# Patient Record
Sex: Female | Born: 1960 | Race: White | Hispanic: No | Marital: Married | Smoking: Never smoker
Health system: Southern US, Community
[De-identification: ages and names within clinical notes are randomized; demographics above are authoritative.]

## PROBLEM LIST (undated history)

## (undated) DIAGNOSIS — E119 Type 2 diabetes mellitus without complications: Secondary | ICD-10-CM

## (undated) DIAGNOSIS — I10 Essential (primary) hypertension: Secondary | ICD-10-CM

## (undated) DIAGNOSIS — K5792 Diverticulitis of intestine, part unspecified, without perforation or abscess without bleeding: Secondary | ICD-10-CM

## (undated) HISTORY — PX: KIDNEY STONE SURGERY: SHX686

## (undated) HISTORY — PX: TONSILLECTOMY: SUR1361

## (undated) HISTORY — PX: CHOLECYSTECTOMY: SHX55

---

## 2005-12-29 ENCOUNTER — Emergency Department (HOSPITAL_COMMUNITY): Admission: EM | Admit: 2005-12-29 | Discharge: 2005-12-29 | Payer: Self-pay | Admitting: Emergency Medicine

## 2006-04-05 ENCOUNTER — Emergency Department (HOSPITAL_COMMUNITY): Admission: EM | Admit: 2006-04-05 | Discharge: 2006-04-06 | Payer: Self-pay | Admitting: Emergency Medicine

## 2009-07-15 ENCOUNTER — Emergency Department (HOSPITAL_BASED_OUTPATIENT_CLINIC_OR_DEPARTMENT_OTHER): Admission: EM | Admit: 2009-07-15 | Discharge: 2009-07-16 | Payer: Self-pay | Admitting: Emergency Medicine

## 2009-07-16 ENCOUNTER — Ambulatory Visit: Payer: Self-pay | Admitting: Diagnostic Radiology

## 2009-07-20 ENCOUNTER — Ambulatory Visit (HOSPITAL_COMMUNITY): Admission: AD | Admit: 2009-07-20 | Discharge: 2009-07-20 | Payer: Self-pay | Admitting: Urology

## 2011-03-03 LAB — URINALYSIS, ROUTINE W REFLEX MICROSCOPIC
Glucose, UA: NEGATIVE mg/dL
Ketones, ur: NEGATIVE mg/dL
Leukocytes, UA: NEGATIVE
Nitrite: NEGATIVE
Protein, ur: NEGATIVE mg/dL
pH: 7 (ref 5.0–8.0)

## 2011-03-03 LAB — BASIC METABOLIC PANEL
CO2: 30 mEq/L (ref 19–32)
Calcium: 9.7 mg/dL (ref 8.4–10.5)
Chloride: 104 mEq/L (ref 96–112)
GFR calc Af Amer: >60 mL/min (ref >60)
GFR calc non Af Amer: >60 mL/min (ref >60)
Glucose, Bld: 113 mg/dL — ABNORMAL HIGH (ref 70–99)

## 2011-03-03 LAB — CBC
HCT: 41.2 % (ref 36.0–46.0)
MCHC: 34.1 g/dL (ref 30.0–36.0)
MCV: 91 fL (ref 78.0–100.0)
RDW: 12.5 % (ref 11.5–15.5)
WBC: 11.4 10*3/uL — ABNORMAL HIGH (ref 4.0–10.5)

## 2011-03-03 LAB — DIFFERENTIAL
Basophils Absolute: 0 10*3/uL (ref 0.0–0.1)
Eosinophils Absolute: 0.1 10*3/uL (ref 0.0–0.7)
Eosinophils Relative: 1 % (ref 0–5)
Lymphocytes Relative: 20 % (ref 12–46)
Monocytes Absolute: 0.8 10*3/uL (ref 0.1–1.0)
Neutro Abs: 8.3 10*3/uL — ABNORMAL HIGH (ref 1.7–7.7)

## 2011-03-03 LAB — URINE MICROSCOPIC-ADD ON

## 2011-03-03 LAB — PREGNANCY, URINE: Preg Test, Ur: NEGATIVE

## 2011-04-10 NOTE — Op Note (Signed)
Barbara Ford, Barbara Ford                ACCOUNT NO.:  [id number]   MEDICAL RECORD NO.:  [id number]          PATIENT TYPE:  AMB   LOCATION:  DAY                          FACILITY:  [facility]   PHYSICIAN:  Excell Seltzer. Annabell Howells, M.D.    DATE OF BIRTH:  Sep 05, 1961   DATE OF PROCEDURE:  07/20/2009  DATE OF DISCHARGE:                               OPERATIVE REPORT   PROCEDURE:  Cystoscopy, left retrograde pyelogram, left ureteroscopic  stone extraction with lasertripsy, insertion of left double-J stent.   PREOPERATIVE DIAGNOSIS:  Left distal ureteral stone.   POSTOPERATIVE DIAGNOSIS:  Left distal ureteral stone.   SURGEON:  Excell Seltzer. Annabell Howells, MD.   ANESTHESIA:  General.   SPECIMENS:  Stone fragments.   DRAINS:  6-French x 24 cm double-J stent.   COMPLICATIONS:  None.   INDICATIONS:  Barbara Ford is a 50 year old white female with a history of  stones, who presented with left flank pain and was found to have an 8 x  6 mm left distal ureteral stone on CT earlier this week.  After  discussing the options, she has elected ureteroscopy.   FINDINGS AND PROCEDURE:  The patient was taken to the operating room  where a general anesthetic was induced.  She was given Cipro.  She was  placed in the lithotomy position.  Her perineum and genitalia were  prepped with Betadine solution.  She was draped in the usual sterile  fashion.  She had asked that I access repair of vault for prolapse.  She  does have a grade 3 broad-based cystocele and a grade 2 distal  rectocele.   Cystoscopy was performed with the 22-French scope and 12 degrees lens.  Examination revealed a normal urethra.  The bladder wall had mild  trabeculation.  No tumors or stones were noted.  The ureteral orifices  were unremarkable.   The left ureteral orifice was cannulated with a 5-French open-end  catheter and contrast was instilled.  This revealed some hydronephrosis  above the stone in the distal ureter.   A guidewire was then passed through  the open-end catheter to the kidney  and the cystoscope was removed.  A  12-French dilator was passed over  the wire to the stone.  It would not advance by the stone.   The 6-French short ureteroscope was then passed alongside the wire to  the stone.  The stone was engaged with a 364 micron laser fiber  initially at 0.5 watts and 5 Hz, but later a 0.8 watts and 10 Hz.  The  stone was fragmented into manageable fragments, which were then grasped  with a nitinol basket and removed.  After removal of all the large  fragments, only a small amount of dust was noted.   I had initially attended to do this stentless, but because of the  impacted nature of the stone and the difficulty getting by the area  impaction once the stone had been broken, I felt that a short-term stent  was indicated.   A 6-French 24 cm double-J stent with string was passed through the  cystoscope  over the wire to the kidney under fluoroscopic guidance.  The  wire was removed, leaving a good coil in the kidney and a good coil in  the bladder.  The stone fragments were collected and will be given to  the family.  Her bladder was drained and the stent string was left  exiting the urethra.  It was tied close to the urethra and trimmed. She  was taken down from the lithotomy position after removal of the drapes,  her anesthetic was reversed, she was taken to the recovery room in  stable condition.  There no complications.  She will go home with a  prescription for Dilaudid and also instructions to get Azo a standard  over-the-counter for bladder discomfort.  She will call to follow up  with me in 2 to 3 weeks and can remove the stent at home in 2 to 3 days.      Excell Seltzer. Annabell Howells, M.D.  Electronically Signed     JJW/MEDQ  D:  07/20/2009  T:  07/20/2009  Job:  [id number]

## 2011-11-18 ENCOUNTER — Emergency Department (HOSPITAL_COMMUNITY): Payer: BC Managed Care – PPO

## 2011-11-18 ENCOUNTER — Emergency Department (HOSPITAL_COMMUNITY)
Admission: EM | Admit: 2011-11-18 | Discharge: 2011-11-19 | Disposition: A | Discharge status: Home / Self Care | Payer: BC Managed Care – PPO | Attending: Emergency Medicine | Admitting: Emergency Medicine

## 2011-11-18 ENCOUNTER — Other Ambulatory Visit: Payer: Self-pay

## 2011-11-18 DIAGNOSIS — R079 Chest pain, unspecified: Secondary | ICD-10-CM | POA: Insufficient documentation

## 2011-11-18 DIAGNOSIS — R10812 Left upper quadrant abdominal tenderness: Secondary | ICD-10-CM | POA: Insufficient documentation

## 2011-11-18 DIAGNOSIS — R112 Nausea with vomiting, unspecified: Secondary | ICD-10-CM

## 2011-11-18 DIAGNOSIS — R42 Dizziness and giddiness: Secondary | ICD-10-CM | POA: Insufficient documentation

## 2011-11-18 DIAGNOSIS — R55 Syncope and collapse: Secondary | ICD-10-CM | POA: Insufficient documentation

## 2011-11-18 HISTORY — DX: Diverticulitis of intestine, part unspecified, without perforation or abscess without bleeding: K57.92

## 2011-11-18 LAB — CARDIAC PANEL(CRET KIN+CKTOT+MB+TROPI)
CK, MB: 1.7 ng/mL (ref 0.3–4.0)
CK, MB: 1.6 ng/mL (ref 0.3–4.0)
Total CK: 98 U/L (ref 7–177)
Total CK: 93 U/L (ref 7–177)
Total CK: 77 U/L (ref 7–177)
Troponin I: <0.3 ng/mL (ref <0.30)

## 2011-11-18 LAB — COMPREHENSIVE METABOLIC PANEL
ALT: 37 U/L — ABNORMAL HIGH (ref 0–35)
Alkaline Phosphatase: 131 U/L — ABNORMAL HIGH (ref 39–117)
BUN: 13 mg/dL (ref 6–23)
GFR calc Af Amer: >90 mL/min (ref >90)
GFR calc non Af Amer: >90 mL/min (ref >90)
Potassium: 3.7 mEq/L (ref 3.5–5.1)
Sodium: 137 mEq/L (ref 135–145)
Total Protein: 7.3 g/dL (ref 6.0–8.3)

## 2011-11-18 LAB — CBC
MCH: 30.9 pg (ref 26.0–34.0)
MCHC: 33.8 g/dL (ref 30.0–36.0)
RBC: 4.37 MIL/uL (ref 3.87–5.11)
WBC: 15.2 10*3/uL — ABNORMAL HIGH (ref 4.0–10.5)

## 2011-11-18 LAB — URINALYSIS, ROUTINE W REFLEX MICROSCOPIC
Glucose, UA: NEGATIVE mg/dL
Protein, ur: NEGATIVE mg/dL
Specific Gravity, Urine: 1.022 (ref 1.005–1.030)
pH: 7 (ref 5.0–8.0)

## 2011-11-18 LAB — MAGNESIUM: Magnesium: 2.1 mg/dL (ref 1.5–2.5)

## 2011-11-18 MED ORDER — ONDANSETRON HCL 4 MG PO TABS: 4.0000 mg | ORAL_TABLET | Freq: Four times a day (QID) | ORAL | Status: AC

## 2011-11-18 MED ORDER — ONDANSETRON HCL 4 MG/2ML IJ SOLN
4.0000 mg | Freq: Once | INTRAMUSCULAR | Status: AC
  Administered 2011-11-18: 4 mg via INTRAMUSCULAR
  Filled 2011-11-18: qty 2

## 2011-11-18 MED ORDER — ASPIRIN 81 MG PO CHEW: 324.0000 mg | CHEWABLE_TABLET | Freq: Once | ORAL | Status: DC

## 2011-11-18 MED ORDER — ONDANSETRON HCL 4 MG/2ML IJ SOLN: 4.0000 mg | Freq: Once | INTRAMUSCULAR | Status: DC

## 2011-11-18 MED ORDER — ONDANSETRON HCL 4 MG/2ML IJ SOLN
INTRAMUSCULAR | Status: AC
  Administered 2011-11-18: 4 mg via INTRAVENOUS
  Filled 2011-11-18: qty 2

## 2011-11-18 MED ORDER — SODIUM CHLORIDE 0.9 % IV SOLN
999.0000 mL | Freq: Once | INTRAVENOUS | Status: AC
  Administered 2011-11-18: 999 mL via INTRAVENOUS

## 2011-11-18 MED ORDER — HYDROMORPHONE HCL PF 1 MG/ML IJ SOLN
INTRAMUSCULAR | Status: AC
  Filled 2011-11-18: qty 1

## 2011-11-18 MED ORDER — HYDROMORPHONE HCL PF 1 MG/ML IJ SOLN
1.0000 mg | Freq: Once | INTRAMUSCULAR | Status: AC
  Administered 2011-11-18: 1 mg via INTRAVENOUS
  Filled 2011-11-18: qty 1

## 2011-11-18 MED ORDER — ONDANSETRON HCL 4 MG/2ML IJ SOLN
4.0000 mg | Freq: Once | INTRAMUSCULAR | Status: AC
  Administered 2011-11-18: 4 mg via INTRAVENOUS
  Filled 2011-11-18: qty 2

## 2011-11-18 MED ORDER — ONDANSETRON HCL 4 MG/2ML IJ SOLN
4.0000 mg | Freq: Once | INTRAMUSCULAR | Status: AC
  Administered 2011-11-18: 4 mg via INTRAVENOUS

## 2011-11-18 MED ORDER — HYDROMORPHONE HCL PF 1 MG/ML IJ SOLN
1.0000 mg | Freq: Once | INTRAMUSCULAR | Status: AC
  Administered 2011-11-18: 1 mg via INTRAVENOUS

## 2011-11-18 MED ORDER — ONDANSETRON HCL 4 MG/2ML IJ SOLN
INTRAMUSCULAR | Status: AC
  Filled 2011-11-18: qty 2

## 2011-11-18 MED ORDER — CIPROFLOXACIN HCL 500 MG PO TABS: 500.0000 mg | ORAL_TABLET | Freq: Two times a day (BID) | ORAL | Status: AC

## 2011-11-18 MED ORDER — METRONIDAZOLE 500 MG PO TABS: 500.0000 mg | ORAL_TABLET | Freq: Two times a day (BID) | ORAL | Status: AC

## 2011-11-18 NOTE — ED Notes (Signed)
CDU full at this time, RN to call back later

## 2011-11-18 NOTE — ED Notes (Signed)
Patient presents via EMS with sudden onset of substernal chest pain, and dizziness at 1400 today with nausea and vomiting, pain was 6/10 with relief from 1 nitro SL.  324 ASA given as well.

## 2011-11-18 NOTE — ED Provider Notes (Signed)
Patient moved to CDU pending completion of cardiac marker testing.  Patient continues to have abdominal discomfort and nausea.  Patient relates recent evaluation for abdominal pain, diagnosed with diverticulitis and/or crohns disease.  Has been referred to a gastroenterologist in [facility] but has not yet been for follow-up.  Patient states her symptoms today are very similar to previous GI presentation.  First two sets of cardiac markers negative, third pending.  Jimmye Norman, NP 11/18/11 2150

## 2011-11-18 NOTE — ED Notes (Signed)
Patient transported to CT 

## 2011-11-18 NOTE — ED Notes (Signed)
Pt stated that she needed to go to the bathroom. Pt able to ambulate to bathroom. Pt states diarrhea and vomited. Pt states abdominal pain and still feels nauseated. MD aware and orders placed.

## 2011-11-18 NOTE — ED Provider Notes (Signed)
History     CSN: [id number]  Arrival date & time 11/18/11  1533   First MD Initiated Contact with Patient 11/18/11 1543      Chief Complaint  Patient presents with  . Chest Pain  . Nausea    (Consider location/radiation/quality/duration/timing/severity/associated sxs/prior treatment) HPI Patient presents immediately after an episode of acute onset n/v w cp and near-syncope.  She was ambulating when her Sx began, and she nearly fell to the ground due to the severity of her Sx.  Her condition eased somewhat (spontaneously), but on presentation she c/o mild LH and persistent nausea.  The CP was described as "pulsating" and "pressure."  The pain was sternal and non-radiating. Past Medical History  Diagnosis Date  . Diverticulitis   . Endometriosis     Past Surgical History  Procedure Date  . Cholecystectomy   . Kidney stone surgery     History reviewed. No pertinent family history.  History  Substance Use Topics  . Smoking status: Never Smoker   . Smokeless tobacco: Not on file  . Alcohol Use: No    OB History    Grav Para Term Preterm Abortions TAB SAB Ect Mult Living                  Review of Systems  Constitutional:       HPI  HENT:       HPI otherwise negative  Eyes: Negative.   Respiratory:       HPI, otherwise negative  Cardiovascular:       HPI, otherwise nmegative  Gastrointestinal: Positive for nausea and vomiting. Negative for abdominal pain.  Genitourinary:       HPI, otherwise negative  Musculoskeletal:       HPI, otherwise negative  Skin: Negative.   Neurological: Negative for syncope.    Allergies  Review of patient's allergies indicates no known allergies.  Home Medications  No current outpatient prescriptions on file.  BP 149/75  Pulse 83  Temp 98 F (36.7 C)  Resp 13  SpO2 97%  Physical Exam  Nursing note and vitals reviewed. Constitutional: She is oriented to person, place, and time. She appears well-developed and  well-nourished. No distress.  HENT:  Head: Normocephalic and atraumatic.  Eyes: Conjunctivae and EOM are normal.  Cardiovascular: Normal rate and regular rhythm.   Pulmonary/Chest: Effort normal and breath sounds normal. No stridor. No respiratory distress.  Abdominal: Normal appearance. She exhibits no distension. There is no hepatosplenomegaly. There is tenderness in the left upper quadrant. There is no rigidity, no rebound, no guarding, no CVA tenderness, no tenderness at McBurney's point and negative Murphy's sign.  Musculoskeletal: She exhibits no edema.  Neurological: She is alert and oriented to person, place, and time. No cranial nerve deficit.  Skin: Skin is warm and dry.  Psychiatric: She has a normal mood and affect.    ED Course  Procedures (including critical care time)   Labs Reviewed  CBC  CARDIAC PANEL(CRET KIN+CKTOT+MB+TROPI)  COMPREHENSIVE METABOLIC PANEL  MAGNESIUM  URINALYSIS, ROUTINE W REFLEX MICROSCOPIC   No results found.   No diagnosis found.  Cardiac Monitor: 82, SR, normal PulseOx 99% RA: Normal   Date: 11/18/2011  Rate:80  Rhythm: normal sinus rhythm  QRS Axis: normal  Intervals: normal  ST/T Wave abnormalities: nonspecific T wave changes  Conduction Disutrbances:none  Narrative Interpretation:   Old EKG Reviewed: changes noted  ABNORMAL ECG  CXR: no acute findings, reviewed by me.  MDM  This 50 year old  female presents following the sudden onset of nausea, vomiting, lightheadedness and chest discomfort. On exam the patient is in no distress, with vital signs that are unremarkable. The patient's presentation is concerning for acute ischemic event versus GI infection. The patient's labs are notable for mild leukocytosis. The patient's ECG is minimally abnormal, and her chest x-ray is further reassuring. Given the absence of ongoing discomfort, the patient's unremarkable evaluation thus far, she will be monitored for cardiac enzyme elevations,  and if these do not occur she'll be discharged to follow up with her primary care physician        Gerhard Munch, MD 11/18/11 [phone]

## 2011-11-18 NOTE — ED Notes (Signed)
Pt vomiting at the time. Medicated. Will continue to monitor. Physician notified.

## 2011-11-18 NOTE — ED Notes (Signed)
Called Barbara Ford (patient's mom per patient's request) just to let her know that patient is doing fine.  [phone]

## 2011-11-18 NOTE — ED Provider Notes (Signed)
Diagnostic results reviewed and discussed with Dr. Jeraldine Loots and with patient.  No clear indication of obstruction on plain films.  Suspect Crohn's flare.  Patient states she would like to attempt management at home.  Patient is requested to follow-up with her PCP and/or gastroenterologist.  Jimmye Norman, NP 11/18/11 [phone]

## 2011-11-19 MED ORDER — OXYCODONE-ACETAMINOPHEN 10-325 MG PO TABS: 1.0000 | ORAL_TABLET | Freq: Four times a day (QID) | ORAL | Status: AC | PRN

## 2011-11-19 NOTE — ED Provider Notes (Signed)
I have seen and evaluated the patient, please see my original note for my exam / MDM.  Gerhard Munch, MD 11/19/11 [phone]

## 2011-11-19 NOTE — ED Provider Notes (Signed)
I have seen and evaluated the patient, please see my original note for my exam / MDM.  Maitri Schnoebelen, MD 11/19/11 0011 

## 2011-11-19 NOTE — ED Notes (Signed)
Pt NAD, resp e/u, AOx4. Pt states understanding of discharge instructions and denies questions at time of dishcarge. Pt ambulatory with steady gait.

## 2017-03-10 ENCOUNTER — Emergency Department (HOSPITAL_COMMUNITY): Payer: No Typology Code available for payment source

## 2017-03-10 ENCOUNTER — Emergency Department (HOSPITAL_COMMUNITY)
Admission: EM | Admit: 2017-03-10 | Discharge: 2017-03-10 | Disposition: A | Discharge status: Home / Self Care | Payer: No Typology Code available for payment source | Attending: Emergency Medicine | Admitting: Emergency Medicine

## 2017-03-10 DIAGNOSIS — Y92410 Unspecified street and highway as the place of occurrence of the external cause: Secondary | ICD-10-CM | POA: Insufficient documentation

## 2017-03-10 DIAGNOSIS — Y9389 Activity, other specified: Secondary | ICD-10-CM | POA: Insufficient documentation

## 2017-03-10 DIAGNOSIS — M545 Low back pain, unspecified: Secondary | ICD-10-CM

## 2017-03-10 DIAGNOSIS — Y999 Unspecified external cause status: Secondary | ICD-10-CM | POA: Insufficient documentation

## 2017-03-10 DIAGNOSIS — S3992XA Unspecified injury of lower back, initial encounter: Secondary | ICD-10-CM | POA: Insufficient documentation

## 2017-03-10 DIAGNOSIS — S199XXA Unspecified injury of neck, initial encounter: Secondary | ICD-10-CM | POA: Insufficient documentation

## 2017-03-10 LAB — BASIC METABOLIC PANEL
Anion gap: 13 (ref 5–15)
BUN: 8 mg/dL (ref 6–20)
CHLORIDE: 101 mmol/L (ref 101–111)
CO2: 25 mmol/L (ref 22–32)
CREATININE: 0.69 mg/dL (ref 0.44–1.00)
Calcium: 9.9 mg/dL (ref 8.9–10.3)
GFR calc non Af Amer: >60 mL/min (ref >60)
Glucose, Bld: 122 mg/dL — ABNORMAL HIGH (ref 65–99)
POTASSIUM: 3.6 mmol/L (ref 3.5–5.1)
SODIUM: 139 mmol/L (ref 135–145)

## 2017-03-10 LAB — CBC WITH DIFFERENTIAL/PLATELET
BASOS PCT: 0 %
Basophils Absolute: 0 10*3/uL (ref 0.0–0.1)
EOS ABS: 0.2 10*3/uL (ref 0.0–0.7)
Eosinophils Relative: 1 %
HEMATOCRIT: 44.6 % (ref 36.0–46.0)
HEMOGLOBIN: 14.5 g/dL (ref 12.0–15.0)
Lymphocytes Relative: 26 %
Lymphs Abs: 3.4 10*3/uL (ref 0.7–4.0)
MCH: 29.2 pg (ref 26.0–34.0)
MCHC: 32.5 g/dL (ref 30.0–36.0)
MCV: 89.9 fL (ref 78.0–100.0)
MONOS PCT: 5 %
Monocytes Absolute: 0.6 10*3/uL (ref 0.1–1.0)
NEUTROS ABS: 8.7 10*3/uL — AB (ref 1.7–7.7)
NEUTROS PCT: 68 %
Platelets: 293 10*3/uL (ref 150–400)
RBC: 4.96 MIL/uL (ref 3.87–5.11)
RDW: 13.8 % (ref 11.5–15.5)
WBC: 12.9 10*3/uL — AB (ref 4.0–10.5)

## 2017-03-10 LAB — CBG MONITORING, ED: GLUCOSE-CAPILLARY: 114 mg/dL — AB (ref 65–99)

## 2017-03-10 LAB — I-STAT TROPONIN, ED: TROPONIN I, POC: 0.01 ng/mL (ref 0.00–0.08)

## 2017-03-10 MED ORDER — MORPHINE SULFATE (PF) 4 MG/ML IV SOLN
4.0000 mg | Freq: Once | INTRAVENOUS | Status: AC
  Administered 2017-03-10: 4 mg via INTRAVENOUS
  Filled 2017-03-10: qty 1

## 2017-03-10 MED ORDER — ONDANSETRON HCL 4 MG/2ML IJ SOLN
4.0000 mg | Freq: Once | INTRAMUSCULAR | Status: AC
  Administered 2017-03-10: 4 mg via INTRAVENOUS
  Filled 2017-03-10: qty 2

## 2017-03-10 MED ORDER — HYDROMORPHONE HCL 1 MG/ML IJ SOLN
1.0000 mg | Freq: Once | INTRAMUSCULAR | Status: AC
  Administered 2017-03-10: 1 mg via INTRAVENOUS
  Filled 2017-03-10: qty 1

## 2017-03-10 MED ORDER — HYDROCODONE-ACETAMINOPHEN 5-325 MG PO TABS: 1.0000 | ORAL_TABLET | ORAL | 0 refills | Status: AC | PRN
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
## 2017-03-10 NOTE — ED Notes (Signed)
Patient transported to CT 

## 2017-03-10 NOTE — Discharge Instructions (Signed)
The CT scans of your neck and low back does not show fracture.  You will be very sore for the next several days.   Take ibuprofen for pain, vicoden for breakthrough and muscle relaxants as needed  Return without fail for worsening symptoms, including escalating pain, inability to walk, confusion, intractable vomiting, or any other symptoms concerning to you.

## 2017-03-10 NOTE — ED Notes (Signed)
Pt able to ambulate around the room

## 2017-09-01 ENCOUNTER — Emergency Department (HOSPITAL_BASED_OUTPATIENT_CLINIC_OR_DEPARTMENT_OTHER)
Admission: EM | Admit: 2017-09-01 | Discharge: 2017-09-01 | Disposition: A | Discharge status: Home / Self Care | Payer: Self-pay | Attending: Emergency Medicine | Admitting: Emergency Medicine

## 2017-09-01 ENCOUNTER — Encounter (HOSPITAL_BASED_OUTPATIENT_CLINIC_OR_DEPARTMENT_OTHER): Payer: Self-pay | Admitting: Emergency Medicine

## 2017-09-01 DIAGNOSIS — I10 Essential (primary) hypertension: Secondary | ICD-10-CM | POA: Insufficient documentation

## 2017-09-01 DIAGNOSIS — E119 Type 2 diabetes mellitus without complications: Secondary | ICD-10-CM | POA: Insufficient documentation

## 2017-09-01 DIAGNOSIS — K047 Periapical abscess without sinus: Secondary | ICD-10-CM | POA: Insufficient documentation

## 2017-09-01 HISTORY — DX: Essential (primary) hypertension: I10

## 2017-09-01 HISTORY — DX: Type 2 diabetes mellitus without complications: E11.9

## 2017-09-01 MED ORDER — ACETAMINOPHEN 500 MG PO TABS: 1000.0000 mg | ORAL_TABLET | Freq: Four times a day (QID) | ORAL | 0 refills | Status: AC | PRN

## 2017-09-01 MED ORDER — IBUPROFEN 800 MG PO TABS: 800.0000 mg | ORAL_TABLET | Freq: Three times a day (TID) | ORAL | 0 refills | Status: AC

## 2017-09-01 MED ORDER — AMOXICILLIN 500 MG PO TABS: 1000.0000 mg | ORAL_TABLET | Freq: Two times a day (BID) | ORAL | 0 refills | Status: DC

## 2017-09-01 NOTE — ED Triage Notes (Signed)
L upper dental pain, notable swelling to face. Reports she broke her tooth 3 weeks ago and has not been able to get in touch with the dentist.

## 2017-09-01 NOTE — ED Notes (Signed)
Pt called in waiting room, in restroom.

## 2017-09-01 NOTE — ED Provider Notes (Signed)
[facility] Provider Note   CSN: [id number] Arrival date & time: 09/01/17  0743     History   Chief Complaint Chief Complaint  Patient presents with  . Dental Pain    HPI Barbara Ford is a 56 y.o. female.  HPI Patient reports she had a tooth that had fractured spontaneously while eating a couple months ago. She was aware that it needed treatment and her dentist suspected it would need to be extracted. She had not been having significant pain in association with that. Due to multiple family members needing assistance patient had put off undergoing her dental treatment. She reports 2 days ago she started to get some discomfort that she was treating with ibuprofen and Tylenol. As of yesterday evening and last night she started to get large swelling and redness in the face. Patient has been trying to call her dentist emergency phone number but has not been getting any response. She denies is been any drainage or discharge. She cannot feel a swollen nodule within the mouth. She has not had a fever. No nausea no vomiting. She has been alternating ibuprofen and Tylenol for pain control. Patient is diabetic. She reports that she has to be very careful with her diet but blood sugars are typically running in the 140s but can go up to 300 if dietary noncompliance. Past Medical History:  Diagnosis Date  . Diabetes mellitus without complication (HCC)   . Diverticulitis   . Endometriosis   . Hypertension     There are no active problems to display for this patient.   Past Surgical History:  Procedure Laterality Date  . CHOLECYSTECTOMY    . KIDNEY STONE SURGERY      OB History    No data available       Home Medications    Prior to Admission medications   Medication Sig Start Date End Date Taking? Authorizing Provider  acetaminophen (TYLENOL) 500 MG tablet Take 2 tablets (1,000 mg total) by mouth every 6 (six) hours as needed. 09/01/17   Arby Barrette, MD  amoxicillin  (AMOXIL) 500 MG tablet Take 2 tablets (1,000 mg total) by mouth 2 (two) times daily. 09/01/17   Arby Barrette, MD  aspirin 325 MG tablet Take 325 mg by mouth every 6 (six) hours as needed for moderate pain.     [provider]  HYDROcodone-acetaminophen (NORCO/VICODIN) 5-325 MG tablet Take 1-2 tablets by mouth every 4 (four) hours as needed. 03/10/17   Lavera Guise, MD  ibuprofen (ADVIL,MOTRIN) 800 MG tablet Take 1 tablet (800 mg total) by mouth 3 (three) times daily. 09/01/17   Arby Barrette, MD    Family History No family history on file.  Social History Social History  Substance Use Topics  . Smoking status: Never Smoker  . Smokeless tobacco: Never Used  . Alcohol use No     Allergies   Codeine   Review of Systems Review of Systems Constitutional: No fever no chills, no malaise Respiratory: No chest pain no cough Endocrine. Blood sugars controlled  Physical Exam Updated Vital Signs BP (!) 165/121 (BP Location: Left Arm)   Pulse 76   Temp 98.3 F (36.8 C) (Oral)   Resp 20   Ht  (1.651 m)   Wt 106.6 kg (235 lb)   SpO2 100%   BMI 39.11 kg/m   Physical Exam  Constitutional: She is oriented to person, place, and time. She appears well-developed and well-nourished.  Patient is uncomfortable in appearance.  She is alert and nontoxic. No restricted distress.  HENT:  Moderate left-sided facial swelling around the nasolabial fold. Face is tender to palpation along the maxilla on the left. See attached images. First molar on the left is very tender to percussion. No visible abscess or palpable fluctuance adjacent to the tooth or the gum proximity to the tooth.  Eyes: EOM are normal.  Neck: Neck supple.  Cardiovascular: Normal rate, regular rhythm, normal heart sounds and intact distal pulses.   Pulmonary/Chest: Effort normal and breath sounds normal.  Musculoskeletal: Normal range of motion. She exhibits no edema.  Neurological: She is alert and oriented to  person, place, and time. No cranial nerve deficit. She exhibits normal muscle tone. Coordination normal.  Skin: Skin is warm and dry.  Psychiatric: She has a normal mood and affect.         ED Treatments / Results  Labs (all labs ordered are listed, but only abnormal results are displayed) Labs Reviewed - No data to display  EKG  EKG Interpretation None       Radiology No results found.  Procedures Procedures (including critical care time)  Medications Ordered in ED Medications - No data to display   Initial Impression / Assessment and Plan / ED Course  I have reviewed the triage vital signs and the nursing notes.  Pertinent labs & imaging results that were available during my care of the patient were reviewed by me and considered in my medical decision making (see chart for details).     Final Clinical Impressions(s) / ED Diagnoses   Final diagnoses:  Dental abscess  Patient has moderate facial swelling consistent with apical abscess. No palpable drainable abscess. At this time will initiate amoxicillin. 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 She will continue with alternating ibuprofen and Tylenol for pain control. Patient will call dentist tomorrow morning.  New Prescriptions New Prescriptions   ACETAMINOPHEN (TYLENOL) 500 MG TABLET    Take 2 tablets (1,000 mg total) by mouth every 6 (six) hours as needed.   AMOXICILLIN (AMOXIL) 500 MG TABLET    Take 2 tablets (1,000 mg total) by mouth 2 (two) times daily.   IBUPROFEN (ADVIL,MOTRIN) 800 MG TABLET    Take 1 tablet (800 mg total) by mouth 3 (three) times daily.     Arby Barrette, MD 09/01/17 [id number]
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
## 2019-11-01 ENCOUNTER — Emergency Department (HOSPITAL_BASED_OUTPATIENT_CLINIC_OR_DEPARTMENT_OTHER)
Admission: EM | Admit: 2019-11-01 | Discharge: 2019-11-01 | Disposition: A | Discharge status: Home / Self Care | Payer: Self-pay | Attending: Emergency Medicine | Admitting: Emergency Medicine

## 2019-11-01 ENCOUNTER — Encounter (HOSPITAL_BASED_OUTPATIENT_CLINIC_OR_DEPARTMENT_OTHER): Payer: Self-pay | Admitting: Emergency Medicine

## 2019-11-01 ENCOUNTER — Other Ambulatory Visit: Payer: Self-pay

## 2019-11-01 DIAGNOSIS — Z79899 Other long term (current) drug therapy: Secondary | ICD-10-CM | POA: Insufficient documentation

## 2019-11-01 DIAGNOSIS — I10 Essential (primary) hypertension: Secondary | ICD-10-CM | POA: Insufficient documentation

## 2019-11-01 DIAGNOSIS — E119 Type 2 diabetes mellitus without complications: Secondary | ICD-10-CM | POA: Insufficient documentation

## 2019-11-01 DIAGNOSIS — J0101 Acute recurrent maxillary sinusitis: Secondary | ICD-10-CM | POA: Insufficient documentation

## 2019-11-01 DIAGNOSIS — J0100 Acute maxillary sinusitis, unspecified: Secondary | ICD-10-CM

## 2019-11-01 DIAGNOSIS — Z885 Allergy status to narcotic agent status: Secondary | ICD-10-CM | POA: Insufficient documentation

## 2019-11-01 MED ORDER — PREDNISONE 20 MG PO TABS: 40.0000 mg | ORAL_TABLET | Freq: Every day | ORAL | 0 refills | Status: AC

## 2019-11-01 MED ORDER — FLUTICASONE PROPIONATE 50 MCG/ACT NA SUSP: 2.0000 | Freq: Every day | NASAL | 0 refills | Status: AC

## 2019-11-01 MED ORDER — AMOXICILLIN-POT CLAVULANATE 875-125 MG PO TABS: 1.0000 | ORAL_TABLET | Freq: Two times a day (BID) | ORAL | 0 refills | Status: DC

## 2019-11-01 NOTE — Discharge Instructions (Signed)
Take the medications

## 2019-11-01 NOTE — ED Provider Notes (Signed)
[facility] EMERGENCY DEPARTMENT Provider Note   CSN: [id number] Arrival date & time: 11/01/19  1722     History   Chief Complaint Chief Complaint  Patient presents with  . Facial Pain    HPI Barbara Ford is a 58 y.o. female with past medical history significant for diabetes, endometriosis, hypertension who presents for evaluation of sinus pressure and congestion.  Patient states she was diagnosed with COVID-19 approximately 8 weeks ago.  Patient states her symptoms have gradually improved.  Over the last 2 weeks she has developed congestion, rhinorrhea, facial pressure over zygomatic processes bilaterally.  She states she took a few of her leftover clindamycin from a previous infection however this did not help.  She also admits to itching to bilateral eyes however denies any drainage, vision changes, pain.  Denies headache, vision changes, lightheadedness, dizziness, neck pain, neck stiffness, chest pain, shortness of breath, sore throat, ear pain, abdominal pain, diarrhea, dysuria, unilateral weakness, drooling, trismus.  Denies additional aggravating or alleviating factors.  Patient states her mother and her husband were diagnosed with MRSA on their skin a few weeks ago.  She does not have any lesions.  Patient states she used to have chronic sinus infections and this feels similar.  History obtained from patient and past medical records.  No interpreter is used.     HPI  Past Medical History:  Diagnosis Date  . Diabetes mellitus without complication (HCC)   . Diverticulitis   . Endometriosis   . Hypertension     There are no active problems to display for this patient.   Past Surgical History:  Procedure Laterality Date  . CHOLECYSTECTOMY    . KIDNEY STONE SURGERY       OB History   No obstetric history on file.      Home Medications    Prior to Admission medications   Medication Sig Start Date End Date Taking? Authorizing Provider  acetaminophen  (TYLENOL) 500 MG tablet Take 2 tablets (1,000 mg total) by mouth every 6 (six) hours as needed. 09/01/17   Arby Barrette, MD  amoxicillin (AMOXIL) 500 MG tablet Take 2 tablets (1,000 mg total) by mouth 2 (two) times daily. 09/01/17   Arby Barrette, MD  amoxicillin-clavulanate (AUGMENTIN) 875-125 MG tablet Take 1 tablet by mouth every 12 (twelve) hours. 11/01/19   Sharalee Witman A, PA-C  aspirin 325 MG tablet Take 325 mg by mouth every 6 (six) hours as needed for moderate pain.     [provider]  fluticasone (FLONASE) 50 MCG/ACT nasal spray Place 2 sprays into both nostrils daily. 11/01/19   Toryn Mcclinton A, PA-C  HYDROcodone-acetaminophen (NORCO/VICODIN) 5-325 MG tablet Take 1-2 tablets by mouth every 4 (four) hours as needed. 03/10/17   Lavera Guise, MD  ibuprofen (ADVIL,MOTRIN) 800 MG tablet Take 1 tablet (800 mg total) by mouth 3 (three) times daily. 09/01/17   Arby Barrette, MD  predniSONE (DELTASONE) 20 MG tablet Take 2 tablets (40 mg total) by mouth daily for 5 days. 11/01/19 11/06/19  Aurelie Dicenzo A, PA-C    Family History No family history on file.  Social History Social History   Tobacco Use  . Smoking status: Never Smoker  . Smokeless tobacco: Never Used  Substance Use Topics  . Alcohol use: No  . Drug use: No     Allergies   Codeine   Review of Systems Review of Systems  Constitutional: Negative.   HENT: Positive for congestion, postnasal drip, rhinorrhea, sinus pressure  and sinus pain. Negative for dental problem, ear discharge, ear pain, facial swelling, mouth sores, nosebleeds, sneezing, sore throat, trouble swallowing and voice change.   Eyes: Positive for itching. Negative for photophobia, pain, discharge, redness and visual disturbance.  Respiratory: Negative.   Cardiovascular: Negative.   Gastrointestinal: Negative.   Genitourinary: Negative.   Musculoskeletal: Negative.   Skin: Negative.   Neurological: Negative.   All other systems  reviewed and are negative.   Physical Exam Updated Vital Signs BP 140/81   Pulse 77   Temp 98.4 F (36.9 C) (Oral)   Resp 20   Ht 5\' 5"  (1.651 m)   Wt 89.8 kg   SpO2 95%   BMI 32.95 kg/m   Physical Exam Vitals signs and nursing note reviewed.  Constitutional:      General: She is not in acute distress.    Appearance: She is not ill-appearing, toxic-appearing or diaphoretic.  HENT:     Head: Normocephalic and atraumatic.     Jaw: There is normal jaw occlusion.     Right Ear: Tympanic membrane, ear canal and external ear normal. There is no impacted cerumen. No hemotympanum. Tympanic membrane is not injected, scarred, perforated, erythematous, retracted or bulging.     Left Ear: Tympanic membrane, ear canal and external ear normal. There is no impacted cerumen. No hemotympanum. Tympanic membrane is not injected, scarred, perforated, erythematous, retracted or bulging.     Ears:     Comments: No Mastoid tenderness.    Nose:     Right Turbinates: Enlarged and swollen.     Left Turbinates: Enlarged and swollen.     Right Sinus: Maxillary sinus tenderness present. No frontal sinus tenderness.     Left Sinus: Maxillary sinus tenderness present. No frontal sinus tenderness.     Comments: Clear rhinorrhea and congestion to bilateral nares.  Boggy erythematous nasal turbinates.  Septal hematomas or epistaxis.  No foreign body.  Bilateral frontal maxillary sinus tenderness palpation    Mouth/Throat:     Lips: Pink.     Mouth: Mucous membranes are moist.     Pharynx: Oropharynx is clear. Uvula midline.     Comments: Posterior oropharynx clear.  Mucous membranes moist.  Tonsils without erythema or exudate.  Uvula midline without deviation.  No evidence of PTA or RPA.  No drooling, dysphasia or trismus.  Phonation normal. Eyes:     General: Lids are everted, no foreign bodies appreciated.     Extraocular Movements: Extraocular movements intact.     Conjunctiva/sclera: Conjunctivae normal.      Visual Fields: Right eye visual fields normal and left eye visual fields normal.     Comments: EOMs intact.  Sclera white without injection.  Neck:     Trachea: Trachea and phonation normal.     Meningeal: Brudzinski's sign and Kernig's sign absent.     Comments: No Neck stiffness or neck rigidity.  No meningismus.  No cervical lymphadenopathy. Cardiovascular:     Comments: No murmurs rubs or gallops. Pulmonary:     Comments: Clear to auscultation bilaterally without wheeze, rhonchi or rales.  No accessory muscle usage.  Able speak in full sentences. Abdominal:     Comments: Soft, nontender without rebound or guarding.  No CVA tenderness.  Musculoskeletal:     Comments: Moves all 4 extremities without difficulty.  Lower extremities without edema, erythema or warmth.  Skin:    Comments: Brisk capillary refill.  No rashes or lesions.  Neurological:     Mental Status:  She is alert.     Comments: Ambulatory in department without difficulty.  Cranial nerves II through XII grossly intact.  No facial droop.  No aphasia.    ED Treatments / Results  Labs (all labs ordered are listed, but only abnormal results are displayed) Labs Reviewed - No data to display  EKG None  Radiology No results found.  Procedures Procedures (including critical care time)  Medications Ordered in ED Medications - No data to display  Initial Impression / Assessment and Plan / ED Course  I have reviewed the triage vital signs and the nursing notes.  Pertinent labs & imaging results that were available during my care of the patient were reviewed by me and considered in my medical decision making (see chart for details).  58 year old female appears otherwise well presents for evaluation of 2 weeks of sinus symptoms.  She is afebrile, nonseptic, non-ill-appearing.  She states she does have history of chronic sinus infections and this feels similar.  However she has not had a sinus infection in the last  "few years."  She is 8 weeks out from Covid infection.  She did not require hospitalization.  She has had 2 weeks of congestion, purulent nasal discharge, rhinorrhea and recently over the last 6 days developed axillary sinus pressure and pain.  She denies any headache, she has a nonfocal neurologic exam without deficits.  She has no facial swelling or overlying skin changes.  She does have boggy erythematous nasal turbinates and tenderness palpation to bilateral maxillary sinuses.  She has no facial crepitus.  She is tolerating p.o. intake at home.  Heart and lungs clear.  No neck stiffness or neck rigidity.  No meningismus.  Given length of symptoms will DC home with antibiotics.  Discussed follow-up with ENT given length of symptoms of her symptoms do not resolve with these antibiotics.  Given she has no headache I have low suspicion for a dural venous sinus thrombosis or other acute intracranial pathology that would require imagine at this time as cause of her sinus pain.  There is no tachycardia, tachypnea or hypoxia.  She appears overall well.  Discussed strict return precautions with patient.  Patient voiced understanding and is agreeable follow-up.      Barbara Ford was evaluated in Emergency Department on 11/01/2019 for the symptoms described in the history of present illness. She was evaluated in the context of the global COVID-19 pandemic, which necessitated consideration that the patient might be at risk for infection with the SARS-CoV-2 virus that causes COVID-19. Institutional protocols and algorithms that pertain to the evaluation of patients at risk for COVID-19 are in a state of rapid change based on information released by regulatory bodies including the CDC and federal and state organizations. These policies and algorithms were followed during the patient's care in the ED. Final Clinical Impressions(s) / ED Diagnoses   Final diagnoses:  Acute non-recurrent maxillary sinusitis    ED  Discharge Orders         Ordered    fluticasone (FLONASE) 50 MCG/ACT nasal spray  Daily     11/01/19 1813    amoxicillin-clavulanate (AUGMENTIN) 875-125 MG tablet  Every 12 hours     11/01/19 1813    predniSONE (DELTASONE) 20 MG tablet  Daily     11/01/19 1813           Francess Mullen A, PA-C 11/01/19 1815    Tilden Fossaees, Elizabeth, MD 11/01/19 2318

## 2019-11-01 NOTE — ED Triage Notes (Signed)
Sinus pressure/pain, congestion x 2 weeks.

## 2019-11-01 NOTE — ED Notes (Signed)
Pt given Rx x 3 at time of discharge (flonase. Augmentin, prednisone)

## 2019-11-21 ENCOUNTER — Emergency Department (HOSPITAL_BASED_OUTPATIENT_CLINIC_OR_DEPARTMENT_OTHER)
Admission: EM | Admit: 2019-11-21 | Discharge: 2019-11-21 | Disposition: A | Discharge status: Home / Self Care | Payer: Self-pay | Attending: Emergency Medicine | Admitting: Emergency Medicine

## 2019-11-21 ENCOUNTER — Encounter (HOSPITAL_BASED_OUTPATIENT_CLINIC_OR_DEPARTMENT_OTHER): Payer: Self-pay | Admitting: *Deleted

## 2019-11-21 ENCOUNTER — Other Ambulatory Visit: Payer: Self-pay

## 2019-11-21 DIAGNOSIS — Y280XXA Contact with sharp glass, undetermined intent, initial encounter: Secondary | ICD-10-CM | POA: Insufficient documentation

## 2019-11-21 DIAGNOSIS — Y939 Activity, unspecified: Secondary | ICD-10-CM | POA: Insufficient documentation

## 2019-11-21 DIAGNOSIS — L089 Local infection of the skin and subcutaneous tissue, unspecified: Secondary | ICD-10-CM | POA: Insufficient documentation

## 2019-11-21 DIAGNOSIS — H00015 Hordeolum externum left lower eyelid: Secondary | ICD-10-CM | POA: Insufficient documentation

## 2019-11-21 DIAGNOSIS — I10 Essential (primary) hypertension: Secondary | ICD-10-CM | POA: Insufficient documentation

## 2019-11-21 DIAGNOSIS — Z7982 Long term (current) use of aspirin: Secondary | ICD-10-CM | POA: Insufficient documentation

## 2019-11-21 DIAGNOSIS — E119 Type 2 diabetes mellitus without complications: Secondary | ICD-10-CM | POA: Insufficient documentation

## 2019-11-21 DIAGNOSIS — Z79899 Other long term (current) drug therapy: Secondary | ICD-10-CM | POA: Insufficient documentation

## 2019-11-21 DIAGNOSIS — S80852A Superficial foreign body, left lower leg, initial encounter: Secondary | ICD-10-CM | POA: Insufficient documentation

## 2019-11-21 DIAGNOSIS — Y999 Unspecified external cause status: Secondary | ICD-10-CM | POA: Insufficient documentation

## 2019-11-21 DIAGNOSIS — Y92000 Kitchen of unspecified non-institutional (private) residence as  the place of occurrence of the external cause: Secondary | ICD-10-CM | POA: Insufficient documentation

## 2019-11-21 MED ORDER — DOXYCYCLINE HYCLATE 100 MG PO CAPS: 100.0000 mg | ORAL_CAPSULE | Freq: Two times a day (BID) | ORAL | 0 refills | Status: DC

## 2019-11-21 MED ORDER — ERYTHROMYCIN 5 MG/GM OP OINT: TOPICAL_OINTMENT | OPHTHALMIC | 0 refills | Status: AC

## 2019-11-21 NOTE — ED Provider Notes (Signed)
[facility] EMERGENCY DEPARTMENT Provider Note   CSN: [id number] Arrival date & time: 11/21/19  1705     History Chief Complaint  Patient presents with  . Eye Drainage    Barbara NottinghamKaren Ford is a 58 y.o. female.  58yo F w/ PMH including T2DM, HTN who p/w eye drainage and foot pain.  Patient states that for the past 5 to 6 weeks, she has had bilateral eye itching and watering.  She was concerned because a family member had MRSA and she had some vaginal lesions prior to onset of eye symptoms; she thinks she may have gotten MRSA in her eyes.  She was seen here in the ED on 12/6 for sinus problems as well as her eye complaints.  She was given Augmentin but states that her eye symptoms have continued. 2 days ago, she woke up and had swelling and pus-like drainage from L lower eyelid.   PT also notes that 1 week ago she broke a glass coffee cup and cleaned it up, not aware of stepping on any glass, however a day or two later she began having progressively worsening pain in her left heel.  She is concerned that there may be a piece of glass stuck in it.  Pain has gotten so severe that she is unable to bear weight on her heel without severe pain.  The history is provided by the patient.       Past Medical History:  Diagnosis Date  . Diabetes mellitus without complication (HCC)   . Diverticulitis   . Endometriosis   . Hypertension     There are no problems to display for this patient.   Past Surgical History:  Procedure Laterality Date  . CHOLECYSTECTOMY    . KIDNEY STONE SURGERY       OB History   No obstetric history on file.     No family history on file.  Social History   Tobacco Use  . Smoking status: Never Smoker  . Smokeless tobacco: Never Used  Substance Use Topics  . Alcohol use: No  . Drug use: No    Home Medications Prior to Admission medications   Medication Sig Start Date End Date Taking? Authorizing Provider  acetaminophen (TYLENOL) 500 MG tablet  Take 2 tablets (1,000 mg total) by mouth every 6 (six) hours as needed. 09/01/17   Arby BarrettePfeiffer, Marcy, MD  aspirin 325 MG tablet Take 325 mg by mouth every 6 (six) hours as needed for moderate pain.     [provider]  doxycycline (VIBRAMYCIN) 100 MG capsule Take 1 capsule (100 mg total) by mouth 2 (two) times daily. 11/21/19   Nyeli Holtmeyer, Ambrose Finlandachel Morgan, MD  erythromycin ophthalmic ointment Place a 1/2 inch ribbon of ointment into the lower eyelid 4 times daily for 5-7 days. 11/21/19   Vasco Chong, Ambrose Finlandachel Morgan, MD  fluticasone (FLONASE) 50 MCG/ACT nasal spray Place 2 sprays into both nostrils daily. 11/01/19   Henderly, Britni A, PA-C  HYDROcodone-acetaminophen (NORCO/VICODIN) 5-325 MG tablet Take 1-2 tablets by mouth every 4 (four) hours as needed. 03/10/17   Lavera GuiseLiu, Dana Duo, MD  ibuprofen (ADVIL,MOTRIN) 800 MG tablet Take 1 tablet (800 mg total) by mouth 3 (three) times daily. 09/01/17   Arby BarrettePfeiffer, Marcy, MD    Allergies    Codeine  Review of Systems   Review of Systems All other systems reviewed and are negative except that which was mentioned in HPI  Physical Exam Updated Vital Signs BP (!) 151/97 (BP Location: Left Arm)  Pulse 88   Temp 98.7 F (37.1 C) (Oral)   Resp 19   Ht 5\' 5"  (1.651 m)   Wt 103.4 kg   SpO2 96%   BMI 37.94 kg/m   Physical Exam Vitals and nursing note reviewed.  Constitutional:      General: She is not in acute distress.    Appearance: She is well-developed.  HENT:     Head: Normocephalic and atraumatic.  Eyes:     Pupils: Pupils are equal, round, and reactive to light.      Comments: R eye normal in appearance w/ no significant conjunctival injection or drainage; L eye with edema and focal area of erythema and pustule on lateral lower eyelid, some adjacent conjunctival injection  Musculoskeletal:     Cervical back: Neck supple.  Skin:    General: Skin is warm and dry.     Comments: Tenderness to palpation of central L heel with central puncture, pus  draining upon applying pressure  Neurological:     Mental Status: She is alert and oriented to person, place, and time.  Psychiatric:        Judgment: Judgment normal.     ED Results / Procedures / Treatments   Labs (all labs ordered are listed, but only abnormal results are displayed) Labs Reviewed - No data to display  EKG None  Radiology No results found.  Procedures .Foreign Body Removal  Date/Time: 11/21/2019 6:20 PM Performed by: 11/23/2019, MD Authorized by: Laurence Spates, MD  Consent: Verbal consent obtained. Consent given by: patient Patient identity confirmed: verbally with patient Body area: skin General location: lower extremity Location details: left foot  Sedation: Patient sedated: no  Patient cooperative: yes Localization method: visualized Removal mechanism: forceps and irrigation Dressing: antibiotic ointment and dressing applied Tendon involvement: none Depth: subcutaneous Complexity: simple 1 objects recovered. Objects recovered: shard of glass Post-procedure assessment: foreign body removed Patient tolerance: patient tolerated the procedure well with no immediate complications   (including critical care time)  Medications Ordered in ED Medications - No data to display  ED Course  I have reviewed the triage vital signs and the nursing notes.     MDM Rules/Calculators/A&P                      Foot: I was able to extract a small piece of glass which had caused localized reaction w/ surrounding pus. Irrigated, applied bacitracin. Recommended course of abx given pt is diabetic and wound is high risk area.   Eye: Given pt's concern for MRSA, doxycycline should cover both foot and eye although current appearance of eye is c/w external hordeolum.  Will give topical antibiotic in addition to doxycycline course and emphasized the importance of warm compresses multiple times daily.  Provided with ophthalmology follow-up and  encouraged to follow closely with them if any concerns that symptoms were not improving.  Have instructed to return here if any worsening symptoms regarding eye or foot. Final Clinical Impression(s) / ED Diagnoses Final diagnoses:  Hordeolum externum of left lower eyelid  Foreign body in left foot with infection, initial encounter    Rx / DC Orders ED Discharge Orders         Ordered    doxycycline (VIBRAMYCIN) 100 MG capsule  2 times daily     11/21/19 1819    erythromycin ophthalmic ointment     11/21/19 1819           Adelena Desantiago, 11/23/19  Lilia Pro, MD 11/21/19 1836

## 2019-11-21 NOTE — ED Triage Notes (Signed)
Pt reports eyes itching and now puffy and having some drainage since Christmas Eve, She thinks it may be MRSA. She also has been having sinus congestion for weeks prior. She also thinks she may have glass in the heel of her left foot

## 2020-10-26 ENCOUNTER — Encounter (HOSPITAL_BASED_OUTPATIENT_CLINIC_OR_DEPARTMENT_OTHER): Payer: Self-pay | Admitting: Emergency Medicine

## 2020-10-26 ENCOUNTER — Emergency Department (HOSPITAL_BASED_OUTPATIENT_CLINIC_OR_DEPARTMENT_OTHER): Payer: Self-pay

## 2020-10-26 ENCOUNTER — Other Ambulatory Visit: Payer: Self-pay

## 2020-10-26 ENCOUNTER — Emergency Department (HOSPITAL_BASED_OUTPATIENT_CLINIC_OR_DEPARTMENT_OTHER)
Admission: EM | Admit: 2020-10-26 | Discharge: 2020-10-26 | Disposition: A | Discharge status: Home / Self Care | Payer: Self-pay | Attending: Emergency Medicine | Admitting: Emergency Medicine

## 2020-10-26 DIAGNOSIS — E119 Type 2 diabetes mellitus without complications: Secondary | ICD-10-CM | POA: Insufficient documentation

## 2020-10-26 DIAGNOSIS — Z20822 Contact with and (suspected) exposure to covid-19: Secondary | ICD-10-CM | POA: Insufficient documentation

## 2020-10-26 DIAGNOSIS — I10 Essential (primary) hypertension: Secondary | ICD-10-CM | POA: Insufficient documentation

## 2020-10-26 DIAGNOSIS — J019 Acute sinusitis, unspecified: Secondary | ICD-10-CM | POA: Insufficient documentation

## 2020-10-26 DIAGNOSIS — J069 Acute upper respiratory infection, unspecified: Secondary | ICD-10-CM | POA: Insufficient documentation

## 2020-10-26 DIAGNOSIS — J189 Pneumonia, unspecified organism: Secondary | ICD-10-CM | POA: Insufficient documentation

## 2020-10-26 DIAGNOSIS — B9689 Other specified bacterial agents as the cause of diseases classified elsewhere: Secondary | ICD-10-CM

## 2020-10-26 LAB — CBC WITH DIFFERENTIAL/PLATELET
Abs Immature Granulocytes: 0.1 10*3/uL — ABNORMAL HIGH (ref 0.00–0.07)
Basophils Absolute: 0.1 10*3/uL (ref 0.0–0.1)
Basophils Relative: 1 %
Eosinophils Absolute: 0.6 10*3/uL — ABNORMAL HIGH (ref 0.0–0.5)
Eosinophils Relative: 4 %
HCT: 43.9 % (ref 36.0–46.0)
Hemoglobin: 14.2 g/dL (ref 12.0–15.0)
Immature Granulocytes: 1 %
Lymphocytes Relative: 25 %
Lymphs Abs: 3.4 10*3/uL (ref 0.7–4.0)
MCH: 29 pg (ref 26.0–34.0)
MCHC: 32.3 g/dL (ref 30.0–36.0)
MCV: 89.8 fL (ref 80.0–100.0)
Monocytes Absolute: 1.1 10*3/uL — ABNORMAL HIGH (ref 0.1–1.0)
Monocytes Relative: 8 %
Neutro Abs: 8.6 10*3/uL — ABNORMAL HIGH (ref 1.7–7.7)
Neutrophils Relative %: 61 %
Platelets: 296 10*3/uL (ref 150–400)
RBC: 4.89 MIL/uL (ref 3.87–5.11)
RDW: 12.8 % (ref 11.5–15.5)
WBC: 13.8 10*3/uL — ABNORMAL HIGH (ref 4.0–10.5)
nRBC: 0 % (ref 0.0–0.2)

## 2020-10-26 LAB — COMPREHENSIVE METABOLIC PANEL
ALT: 35 U/L (ref 0–44)
AST: 28 U/L (ref 15–41)
Albumin: 3.6 g/dL (ref 3.5–5.0)
Alkaline Phosphatase: 123 U/L (ref 38–126)
Anion gap: 12 (ref 5–15)
BUN: 10 mg/dL (ref 8–23)
CO2: 24 mmol/L (ref 22–32)
Calcium: 8.9 mg/dL (ref 8.9–10.3)
Chloride: 98 mmol/L (ref 98–111)
Creatinine, Ser: 0.58 mg/dL (ref 0.44–1.00)
GFR, Estimated: >60 mL/min (ref >60)
Glucose, Bld: 398 mg/dL — ABNORMAL HIGH (ref 70–99)
Potassium: 3.8 mmol/L (ref 3.5–5.1)
Sodium: 134 mmol/L — ABNORMAL LOW (ref 135–145)
Total Bilirubin: 0.4 mg/dL (ref 0.3–1.2)
Total Protein: 8 g/dL (ref 6.5–8.1)

## 2020-10-26 LAB — RESP PANEL BY RT-PCR (FLU A&B, COVID) ARPGX2
Influenza A by PCR: NEGATIVE
Influenza B by PCR: NEGATIVE
SARS Coronavirus 2 by RT PCR: NEGATIVE

## 2020-10-26 LAB — PROCALCITONIN: Procalcitonin: 0.23 ng/mL

## 2020-10-26 LAB — LACTIC ACID, PLASMA: Lactic Acid, Venous: 1.8 mmol/L (ref 0.5–1.9)

## 2020-10-26 LAB — GROUP A STREP BY PCR: Group A Strep by PCR: NOT DETECTED

## 2020-10-26 MED ORDER — CEPHALEXIN 500 MG PO CAPS: 500.0000 mg | ORAL_CAPSULE | Freq: Three times a day (TID) | ORAL | 0 refills | Status: AC

## 2020-10-26 MED ORDER — DOXYCYCLINE HYCLATE 100 MG PO CAPS: 100.0000 mg | ORAL_CAPSULE | Freq: Two times a day (BID) | ORAL | 0 refills | Status: AC

## 2020-10-26 NOTE — ED Triage Notes (Addendum)
Pt reports cough, left ear pain and sore throat for several days, has had SHOB for the past two; pt also reports N/V and dizziness; pt denies fever; pt took Nyquil and two Excedrin around midnight; pt ambulated from registration to bathroom, oxygen stayed 93-94%; pt gait was slightly unsteady, reported some dizziness

## 2020-10-26 NOTE — ED Notes (Signed)
SPO2 while ambulating 94%.

## 2020-10-26 NOTE — ED Notes (Signed)
Patient transported to X-ray 

## 2020-10-26 NOTE — ED Provider Notes (Signed)
[facility] EMERGENCY DEPARTMENT Provider Note   CSN: [id number] Arrival date & time: 10/26/20  8182     History Chief Complaint  Patient presents with  . Shortness of Breath    Barbara Ford is a 59 y.o. female.  HPI      3 days after thanksgiving developed symptoms Daughters were sick on thanksgiving, milder version  Productive cough, waking up every hour with large clots from left nostril Severe sore throat, left ear ache severe, laryngitis now for 2-3 days, worsening, was trying to wait it out, daughters were better  Children sick at the daycare were having sam symptoms as daughters Dizziness comes and goes, feels like when walking feels a little unsteady Headache, had a few days of vomiting but hasn't thrown up in last few hours. Was steady vomiting.  No diarrhea. In last 2 days developed shortness of breath. Fatigue. Loss of appetite. Has loss of taste and smell. No known fevers. Body aches.  No chest pain.  Denies numbness, weakness, difficulty talking, visual changes or facial droop.  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 Diabetes mellitus without complication (HCC)   . Diverticulitis   . Endometriosis   . Hypertension     There are no problems to display for this patient.   Past Surgical History:  Procedure Laterality Date  . CHOLECYSTECTOMY    . KIDNEY STONE SURGERY    . TONSILLECTOMY       OB History   No obstetric history on file.     No family history on file.  Social History   Tobacco Use  . Smoking status: Never Smoker  . Smokeless tobacco: Never Used  Substance Use Topics  . Alcohol use: No  . 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 Authorizing Provider  acetaminophen (TYLENOL) 500 MG  tablet Take 2 tablets (1,000 mg total) by mouth every 6 (six) hours as needed. 09/01/17   Arby Barrette, MD  aspirin 325 MG tablet Take 325 mg by mouth every 6 (six) hours as needed for moderate pain.     [provider]  cephALEXin (KEFLEX) 500 MG capsule Take 1 capsule (500 mg total) by mouth 3 (three) times daily for 7 days. 10/26/20 11/02/20  Alvira Monday, MD  doxycycline (VIBRAMYCIN) 100 MG capsule Take 1 capsule (100 mg total) by mouth 2 (two) times daily for 7 days. 10/26/20 11/02/20  Alvira Monday, MD  erythromycin ophthalmic ointment Place a 1/2 inch ribbon of ointment into the lower eyelid 4 times daily for 5-7 days. 11/21/19   Little, Ambrose Finland, MD  fluticasone (FLONASE) 50 MCG/ACT nasal spray Place 2 sprays into both nostrils daily. 11/01/19   Henderly, Britni A, PA-C  HYDROcodone-acetaminophen (NORCO/VICODIN) 5-325 MG tablet Take 1-2 tablets by mouth every 4 (four) hours as needed. 03/10/17   Lavera Guise, MD  ibuprofen (ADVIL,MOTRIN) 800 MG tablet Take 1 tablet (800 mg total) by mouth 3 (three) times daily. 09/01/17   Arby Barrette, MD    Allergies    Codeine  Review of Systems   Review of Systems  Constitutional: Positive for appetite change and fatigue. Negative for fever.  HENT: Positive for sore throat.   Eyes: Negative for visual disturbance.  Respiratory: Positive for cough. Negative for shortness of breath.   Cardiovascular: Negative for chest pain.  Gastrointestinal: Positive for nausea and vomiting. Negative for abdominal pain and diarrhea.  Genitourinary: Negative for difficulty urinating.  Musculoskeletal: Negative for back pain and neck pain.  Skin: Negative for rash.  Neurological: Positive for headaches. Negative for syncope, facial asymmetry, weakness and numbness.    Physical Exam Updated Vital Signs BP (!) 154/89 (BP Location: Right Arm)   Pulse 87   Temp 97.7 F (36.5 C) (Oral)   Resp 18   Ht 5\' 5"  (1.651 m)   Wt 103.4 kg   SpO2 96%    BMI 37.94 kg/m   Physical Exam Vitals and nursing note reviewed.  Constitutional:      General: She is not in acute distress.    Appearance: She is well-developed. She is not diaphoretic.  HENT:     Head: Normocephalic and atraumatic.  Eyes:     Conjunctiva/sclera: Conjunctivae normal.  Cardiovascular:     Rate and Rhythm: Normal rate and regular rhythm.     Heart sounds: Normal heart sounds. No murmur heard.  No friction rub. No gallop.   Pulmonary:     Effort: Pulmonary effort is normal. No respiratory distress.     Breath sounds: Normal breath sounds. No wheezing or rales.  Abdominal:     General: There is no distension.     Palpations: Abdomen is soft.     Tenderness: There is no abdominal tenderness. There is no guarding.  Musculoskeletal:        General: No tenderness.     Cervical back: Normal range of motion.  Skin:    General: Skin is warm and dry.     Findings: No erythema or rash.  Neurological:     Mental Status: She is alert and oriented to person, place, and time.     ED Results / Procedures / Treatments   Labs (all labs ordered are listed, but only abnormal results are displayed) Labs Reviewed  CBC WITH DIFFERENTIAL/PLATELET - Abnormal; Notable for the following components:      Result Value   WBC 13.8 (*)    Neutro Abs 8.6 (*)    Monocytes Absolute 1.1 (*)    Eosinophils Absolute 0.6 (*)    Abs Immature Granulocytes 0.10 (*)    All other components within normal limits  COMPREHENSIVE METABOLIC PANEL - Abnormal; Notable for the following components:   Sodium 134 (*)    Glucose, Bld 398 (*)    All other components within normal limits  GROUP A STREP BY PCR  RESP PANEL BY RT-PCR (FLU A&B, COVID) [id number]  CULTURE, BLOOD (ROUTINE X 2)  CULTURE, BLOOD (ROUTINE X 2)  LACTIC ACID, PLASMA  PROCALCITONIN    EKG EKG Interpretation  Date/Time:  Wednesday October 26 2020 06:37:36 EST Ventricular Rate:  95 PR Interval:    QRS Duration: 95 QT  Interval:  366 QTC Calculation: 461 R Axis:   69 Text Interpretation: Sinus rhythm Low voltage, precordial leads Rate is faster Confirmed by Molpus, John ([id number]) on 10/26/2020 6:43:01 AM   Radiology DG Chest 2 View  Result Date: 10/26/2020 CLINICAL DATA:  Shortness of breath and cough EXAM: CHEST - 2 VIEW COMPARISON:  03/10/2017 FINDINGS: Borderline heart size that is stable. Low volume chest with atelectasis versus early infiltrates. There is no edema, air bronchogram, effusion, or pneumothorax. IMPRESSION: Mild atelectasis versus early infiltrates at the bases. Electronically Signed   By: 03/12/2017  Watts M.D.   On: 10/26/2020 07:06    Procedures Procedures (including critical care time)  Medications Ordered in ED Medications - No data to display  ED Course  I have reviewed the triage vital signs and the nursing notes.  Pertinent labs & imaging results that were available during my care of the patient were reviewed by me and considered in my medical decision making (see chart for details).    MDM Rules/Calculators/A&P                          59yo female with history of DM, htn, presents with concern for cough, congestion, sore throat, fatigue, dyspnea, body aches.  Combination of symptoms consistent with viral syndrome.  Initial concern for COVID.  Had hypoxia as I walked into room for evaluation, was sleeping and reports suspected sleep apnea.  Observed off of oxygen for 4 hours without hypoxia, no hypoxia on ambulation x2.  Suspect that witnessed episode related to sleep apnea.  XR with atelectasis vs bibasilar infiltrates.  Labs show no signs of sepsis.  COVID/flu testing negative.  Suspect viral URI however given worsening and findings on XR< will treat for community acquired pneumonia/sinusitis with abx.  Discussed reasons to return in detail. Patient discharged in stable condition with understanding of reasons to return.     Final Clinical Impression(s) / ED Diagnoses Final  diagnoses:  Acute bacterial sinusitis  Community acquired pneumonia, unspecified laterality  Upper respiratory tract infection, unspecified type    Rx / DC Orders ED Discharge Orders         Ordered    cephALEXin (KEFLEX) 500 MG capsule  3 times daily        10/26/20 1049    doxycycline (VIBRAMYCIN) 100 MG capsule  2 times daily        10/26/20 1049           Alvira Monday, MD 10/26/20 2227

## 2020-10-31 LAB — CULTURE, BLOOD (ROUTINE X 2)
Culture: NO GROWTH
Culture: NO GROWTH
Special Requests: ADEQUATE
Special Requests: ADEQUATE

## 2021-05-29 IMAGING — CR DG CHEST 2V
2 series · 2 of 2 positions shown · non-contrast
Comparison: 03/10/2017

CLINICAL DATA: Shortness of breath and cough

EXAM:
CHEST - 2 VIEW

[w chest pa]
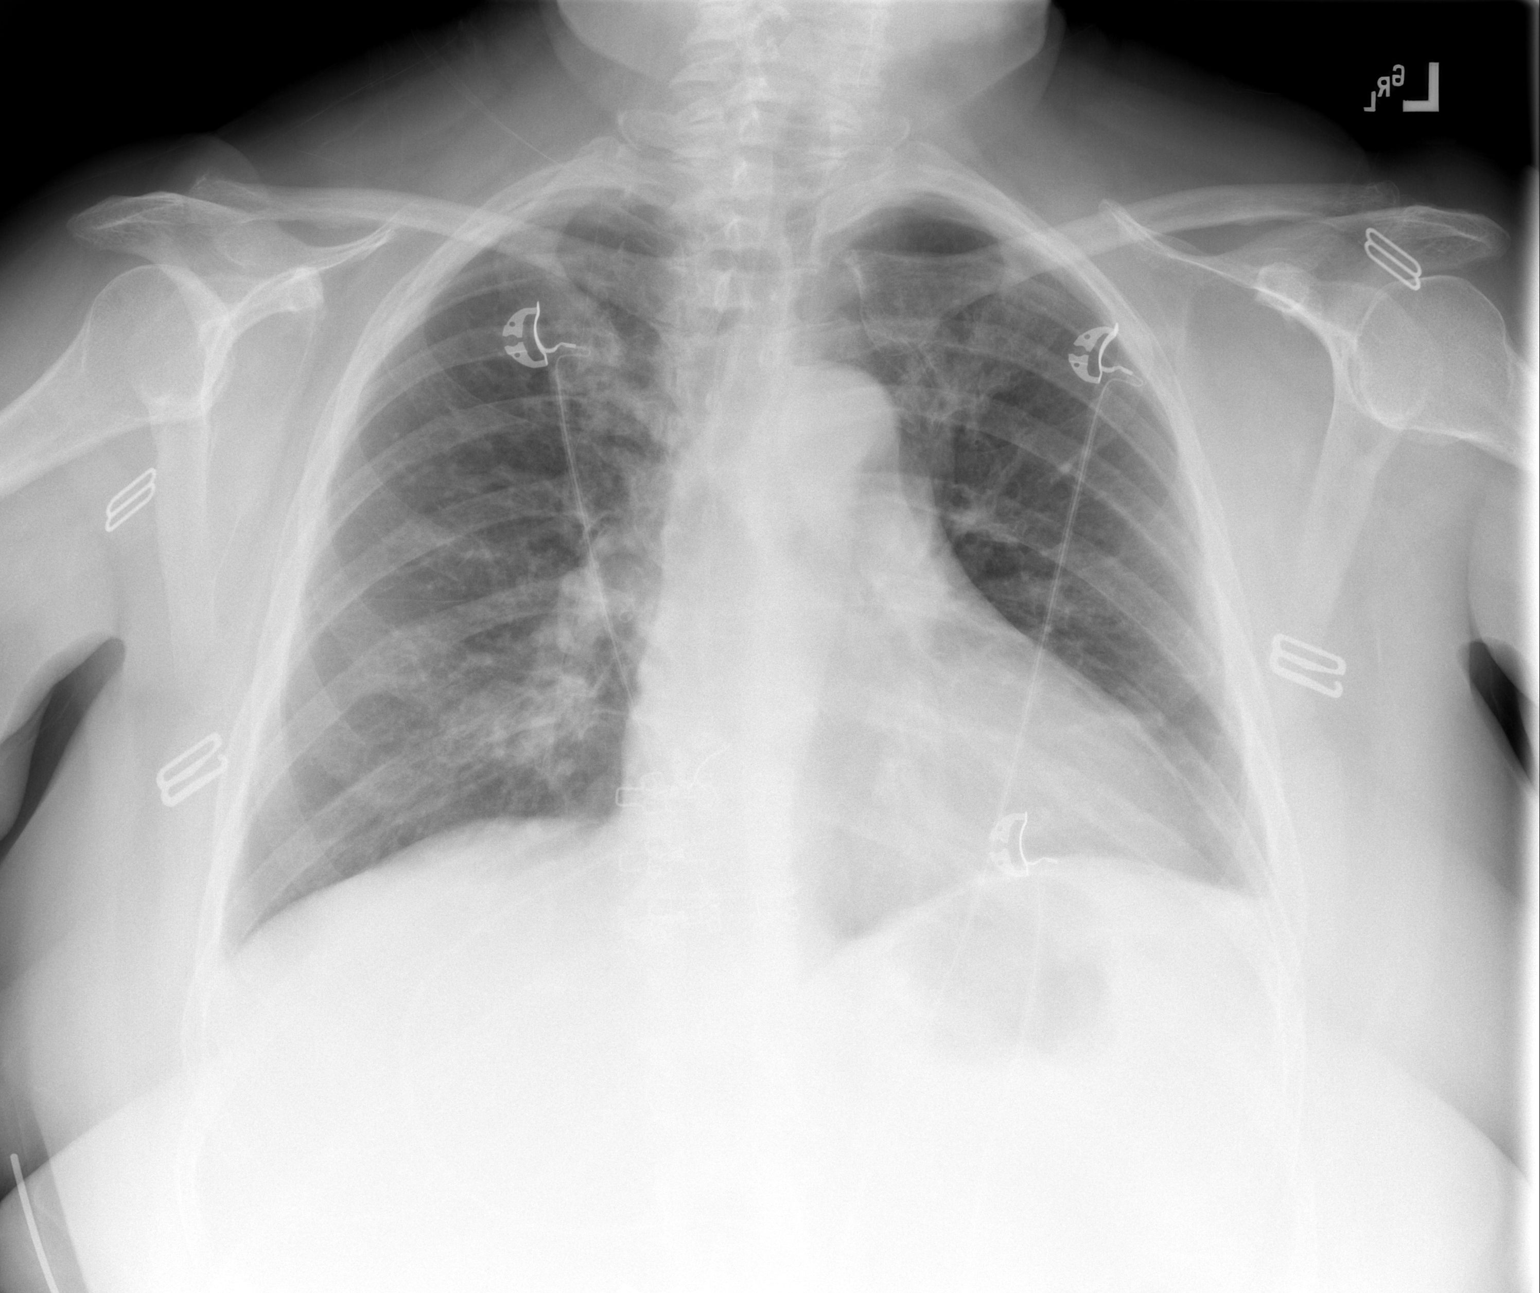

[w chest lat]
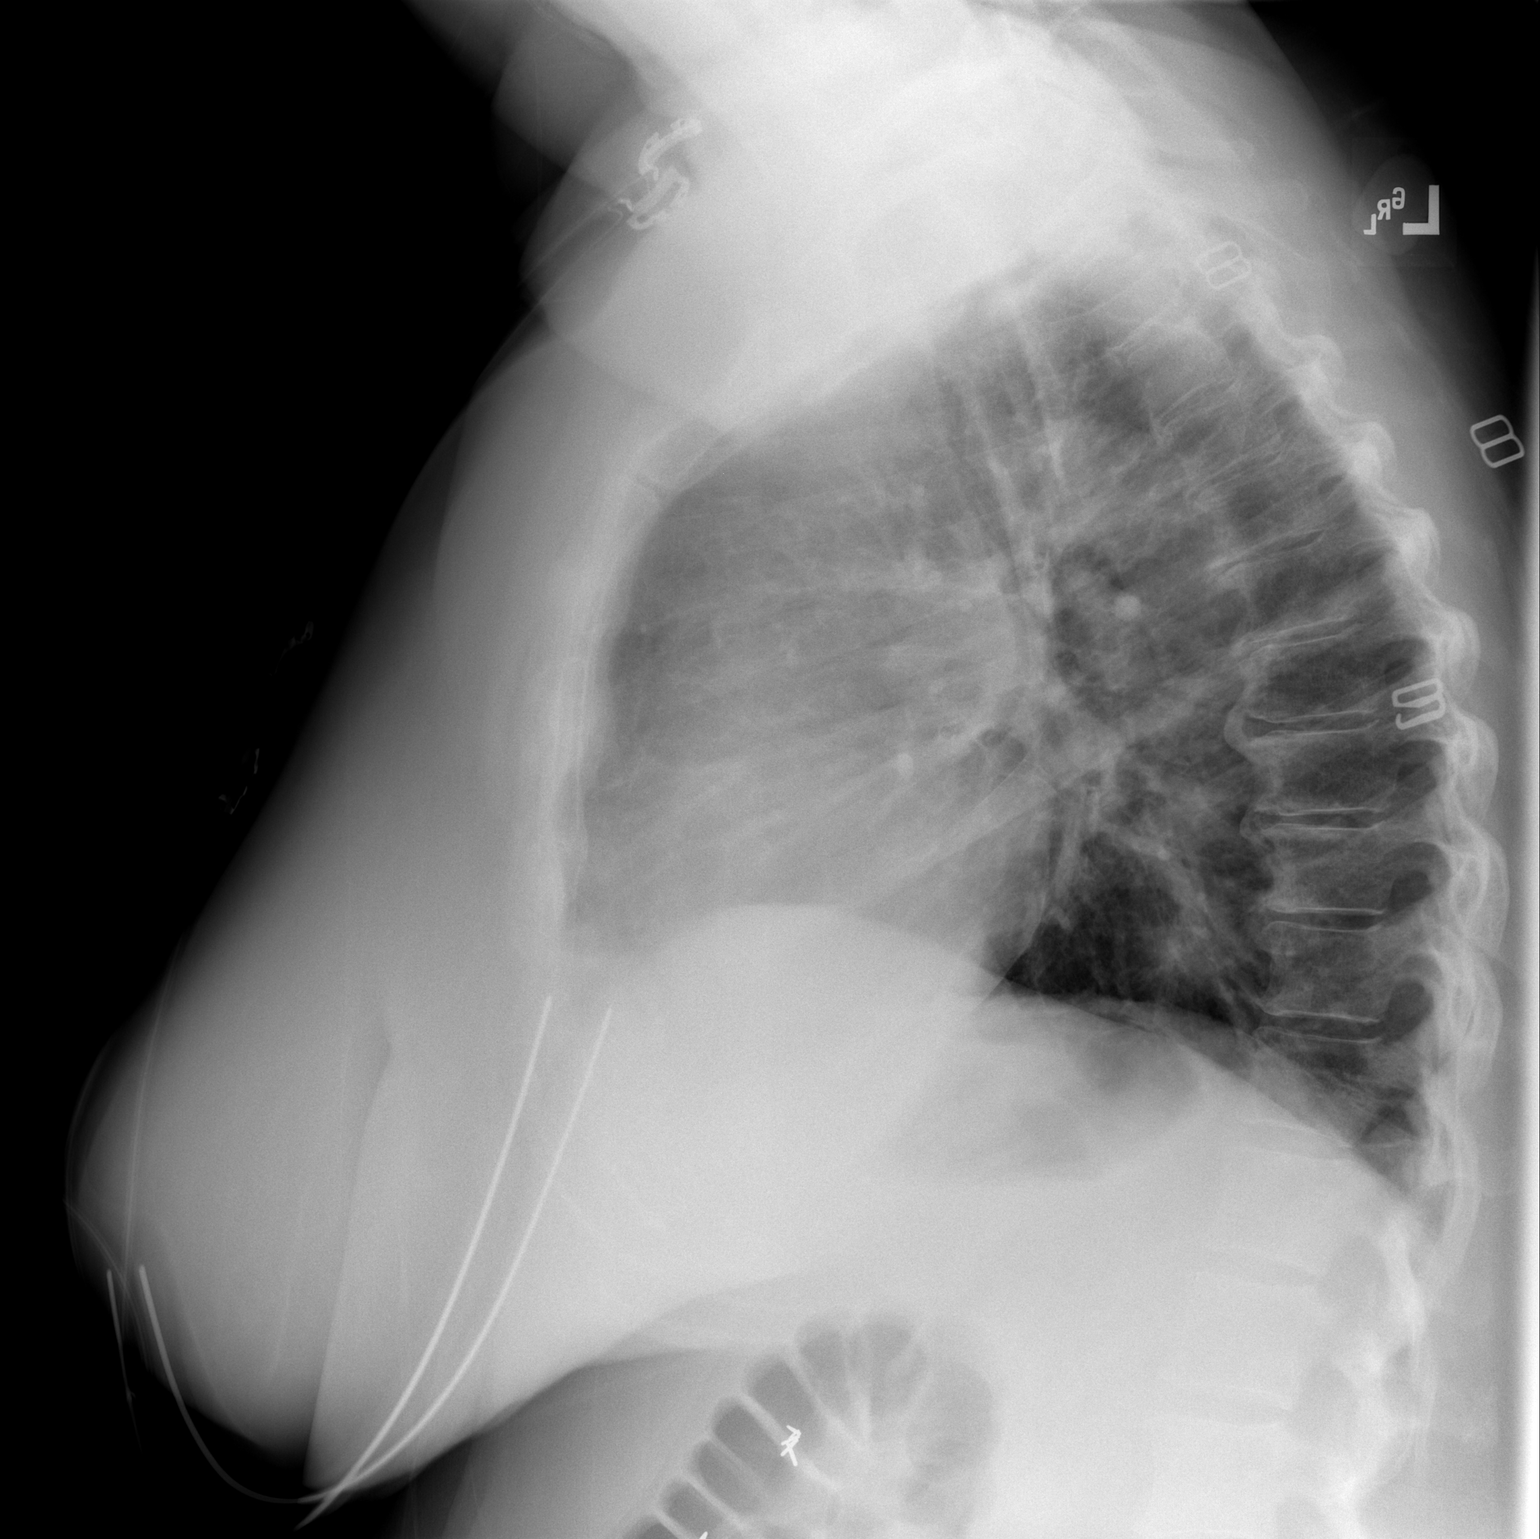

[2 of 2 positions shown; findings below may reference images not displayed]

FINDINGS: Borderline heart size that is stable. Low volume chest with
atelectasis versus early infiltrates. There is no edema, air
bronchogram, effusion, or pneumothorax.
IMPRESSION: Mild atelectasis versus early infiltrates at the bases.
